# Patient Record
Sex: Male | Born: 1995 | Race: White | Hispanic: No | Marital: Single | Smoking: Never smoker
Health system: Southern US, Community
[De-identification: ages and names within clinical notes are randomized; demographics above are authoritative.]

---
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
## 2017-02-16 ENCOUNTER — Other Ambulatory Visit (HOSPITAL_COMMUNITY): Admission: AD | Admit: 2017-02-16 | Payer: Self-pay | Source: Skilled Nursing Facility | Admitting: *Deleted

## 2019-05-14 DIAGNOSIS — Z719 Counseling, unspecified: Secondary | ICD-10-CM | POA: Diagnosis not present

## 2019-05-14 DIAGNOSIS — Z20828 Contact with and (suspected) exposure to other viral communicable diseases: Secondary | ICD-10-CM | POA: Diagnosis not present

## 2019-05-17 DIAGNOSIS — Z0000 Encounter for general adult medical examination without abnormal findings: Secondary | ICD-10-CM | POA: Diagnosis not present

## 2019-05-17 DIAGNOSIS — U071 COVID-19: Secondary | ICD-10-CM | POA: Diagnosis not present

## 2019-05-20 DIAGNOSIS — Z1159 Encounter for screening for other viral diseases: Secondary | ICD-10-CM | POA: Diagnosis not present

## 2019-05-21 DIAGNOSIS — Z23 Encounter for immunization: Secondary | ICD-10-CM | POA: Diagnosis not present

## 2019-05-21 DIAGNOSIS — Z0000 Encounter for general adult medical examination without abnormal findings: Secondary | ICD-10-CM | POA: Diagnosis not present

## 2019-05-21 DIAGNOSIS — Z6820 Body mass index (BMI) 20.0-20.9, adult: Secondary | ICD-10-CM | POA: Diagnosis not present

## 2019-12-04 DIAGNOSIS — Z23 Encounter for immunization: Secondary | ICD-10-CM | POA: Diagnosis not present
